# Patient Record
Sex: Male | Born: 1995 | Race: White | Hispanic: No | Marital: Single | Smoking: Never smoker
Health system: Southern US, Community
[De-identification: ages and names within clinical notes are randomized; demographics above are authoritative.]

## PROBLEM LIST (undated history)

## (undated) DIAGNOSIS — Z0282 Encounter for adoption services: Secondary | ICD-10-CM

## (undated) DIAGNOSIS — Z789 Other specified health status: Secondary | ICD-10-CM

## (undated) HISTORY — DX: Encounter for adoption services: Z02.82

## (undated) HISTORY — DX: Other specified health status: Z78.9

---
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
## 2019-04-15 ENCOUNTER — Other Ambulatory Visit: Payer: Self-pay

## 2019-04-15 ENCOUNTER — Ambulatory Visit: Payer: BC Managed Care – PPO | Admitting: Medical

## 2019-04-15 ENCOUNTER — Encounter: Payer: Self-pay | Admitting: Medical

## 2019-04-15 VITALS — BP 138/78 | HR 91 | Temp 97.7°F | Ht 72.0 in | Wt 232.8 lb

## 2019-04-15 DIAGNOSIS — M25561 Pain in right knee: Secondary | ICD-10-CM

## 2019-04-15 DIAGNOSIS — S8991XA Unspecified injury of right lower leg, initial encounter: Secondary | ICD-10-CM | POA: Insufficient documentation

## 2019-04-15 DIAGNOSIS — G8929 Other chronic pain: Secondary | ICD-10-CM | POA: Diagnosis not present
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
## 2019-04-15 NOTE — Patient Instructions (Signed)
Please go to [facility] for your right knee xray.   Their hours are 8am - 3:30 pm Monday - Friday.  Take your insurance card with you.  [facility] [phone]  [address], [address] [place], [place] [address]  [address] [place], [place] [address]

## 2019-04-22 ENCOUNTER — Ambulatory Visit
Admission: RE | Admit: 2019-04-22 | Discharge: 2019-04-22 | Disposition: A | Discharge status: Home / Self Care | Payer: BC Managed Care – PPO | Source: Ambulatory Visit | Attending: Medical | Admitting: Medical

## 2019-04-22 DIAGNOSIS — S8991XA Unspecified injury of right lower leg, initial encounter: Secondary | ICD-10-CM

## 2019-04-22 DIAGNOSIS — G8929 Other chronic pain: Secondary | ICD-10-CM

## 2019-04-22 DIAGNOSIS — M25561 Pain in right knee: Secondary | ICD-10-CM

## 2020-08-31 ENCOUNTER — Encounter: Payer: Self-pay | Admitting: Medical

## 2020-08-31 ENCOUNTER — Ambulatory Visit: Payer: BC Managed Care – PPO | Admitting: Medical

## 2020-08-31 ENCOUNTER — Other Ambulatory Visit: Payer: Self-pay

## 2020-08-31 VITALS — BP 160/82 | HR 88 | Ht 72.0 in | Wt 216.4 lb

## 2020-08-31 DIAGNOSIS — I10 Essential (primary) hypertension: Secondary | ICD-10-CM | POA: Diagnosis not present

## 2020-08-31 DIAGNOSIS — Z113 Encounter for screening for infections with a predominantly sexual mode of transmission: Secondary | ICD-10-CM

## 2020-08-31 DIAGNOSIS — Z136 Encounter for screening for cardiovascular disorders: Secondary | ICD-10-CM | POA: Insufficient documentation

## 2020-08-31 DIAGNOSIS — Z0000 Encounter for general adult medical examination without abnormal findings: Secondary | ICD-10-CM | POA: Diagnosis not present

## 2020-08-31 DIAGNOSIS — Z23 Encounter for immunization: Secondary | ICD-10-CM | POA: Diagnosis not present

## 2020-08-31 DIAGNOSIS — E739 Lactose intolerance, unspecified: Secondary | ICD-10-CM | POA: Insufficient documentation

## 2020-08-31 DIAGNOSIS — Z13220 Encounter for screening for lipoid disorders: Secondary | ICD-10-CM | POA: Diagnosis not present

## 2020-08-31 LAB — POCT URINALYSIS DIP (PROADVANTAGE DEVICE)
Bilirubin, UA: NEGATIVE
Blood, UA: NEGATIVE
Glucose, UA: NEGATIVE mg/dL
Ketones, POC UA: NEGATIVE mg/dL
Leukocytes, UA: NEGATIVE
Nitrite, UA: NEGATIVE
Protein Ur, POC: NEGATIVE mg/dL
Specific Gravity, Urine: 1.015
Urobilinogen, Ur: 0.2
pH, UA: 6 (ref 5.0–8.0)

## 2020-08-31 NOTE — Patient Instructions (Signed)
This visit was a preventative care visit, also known as wellness visit or routine physical.   Topics typically include healthy lifestyle, diet, exercise, preventative care, vaccinations, sick and well care, proper use of emergency dept and after hours care, as well as other concerns.     Recommendations: Continue to return yearly for your annual wellness and preventative care visits.  This gives [place] a chance to discuss healthy lifestyle, exercise, vaccinations, review your chart record, and perform screenings where appropriate.  I recommend you see your eye doctor yearly for routine vision care.  I recommend you see your dentist yearly for routine dental care including hygiene visits twice yearly.   Vaccination recommendations were reviewed Immunization History  Administered Date(s) Administered  . DTaP 09/23/1998, 11/05/1998, 03/09/1999, 09/03/1999, 09/10/2002  . HPV Quadrivalent 12/29/2008, 04/01/2009, 07/17/2009  . Hepatitis A 07/25/2005, 12/29/2005  . Hepatitis B 09/23/1998, 03/09/1999, 09/03/1999  . HiB (PRP-OMP) 09/23/1998, 11/05/1998, 09/03/1999  . IPV 09/23/1998, 11/05/1998, 09/03/1999, 09/10/2002  . Influenza,inj,Quad PF,6+ Mos 03/14/2016  . Influenza-Unspecified 03/11/2015  . MMR 12/01/1998, 09/10/2002  . Meningococcal B, OMV 07/10/2015, 07/10/2015, 08/18/2015  . Meningococcal Conjugate 09/10/2008  . Meningococcal Polysaccharide 03/27/2014  . Pneumococcal Conjugate-13 09/23/1998, 11/05/1998, 03/27/2014  . Td 09/10/2008  . Tdap 09/10/2008  . Varicella 02/19/2001, 12/29/2005    Counseled on the Td (tetanus, diptheria) vaccine.  Vaccine information sheet given. Td vaccine given after consent obtained.  Advised covid vaccine, yearly flu vaccine    Screening for cancer: Colon cancer screening: Age 25yo  Testicular cancer screening You should do a monthly self testicular exam if you are between 23-40 years old  We discussed PSA, prostate exam, and prostate cancer  screening risks/benefits.   Age 25  Skin cancer screening: Check your skin regularly for new changes, growing lesions, or other lesions of concern Come in for evaluation if you have skin lesions of concern.  Lung cancer screening: If you have a greater than 20 pack year history of tobacco use, then you may qualify for lung cancer screening with a chest CT scan.   Please call your insurance company to inquire about coverage for this test.  We currently don't have screenings for other cancers besides breast, cervical, colon, and lung cancers.  If you have a strong family history of cancer or have other cancer screening concerns, please let me know.    Bone health: Get at least 150 minutes of aerobic exercise weekly Get weight bearing exercise at least once weekly Bone density test:   A bone density test is an imaging test that uses a type of X-ray to measure the amount of calcium and other minerals in your bones.  The test may be used to diagnose or screen you for a condition that causes weak or thin bones (osteoporosis), predict your risk for a broken bone (fracture), or determine how well your osteoporosis treatment is working. The bone density test is recommended for females 23 and older, or females or males <09 if certain risk factors such as thyroid disease, long term use of steroids such as for asthma or rheumatological issues, vitamin D deficiency, estrogen deficiency, family history of osteoporosis, self or family history of fragility fracture in first degree relative.    Heart health: Get at least 150 minutes of aerobic exercise weekly Limit alcohol It is important to maintain a healthy blood pressure and healthy cholesterol numbers  Heart disease screening: Screening for heart disease includes screening for blood pressure, fasting lipids, glucose/diabetes screening, BMI height to weight  ratio, reviewed of smoking status, physical activity, and diet.    Goals include blood  pressure 120/80 or less, maintaining a healthy lipid/cholesterol profile, preventing diabetes or keeping diabetes numbers under good control, not smoking or using tobacco products, exercising most days per week or at least 150 minutes per week of exercise, and eating healthy variety of fruits and vegetables, healthy oils, and avoiding unhealthy food choices like fried food, fast food, high sugar and high cholesterol foods.    Other tests may possibly include EKG test, CT coronary calcium score, echocardiogram, exercise treadmill stress test.     Medical care options: I recommend you continue to seek care here first for routine care.  We try really hard to have available appointments Monday through Friday daytime hours for sick visits, acute visits, and physicals.  Urgent care should be used for after hours and weekends for significant issues that cannot wait till the next day.  The emergency department should be used for significant potentially life-threatening emergencies.  The emergency department is expensive, can often have long wait times for less significant concerns, so try to utilize primary care, urgent care, or telemedicine when possible to avoid unnecessary trips to the emergency department.  Virtual visits and telemedicine have been introduced since the pandemic started in 2020, and can be convenient ways to receive medical care.  We offer virtual appointments as well to assist you in a variety of options to seek medical care.    Separate significant issues discussed: High blood pressure  Check your blood pressure readings 3 to 4 days/week and write these down.  Goal is 120/70  High blood pressure is 140/90 or higher  Limit salt, cut back on alcohol, continue regular exercise  Pending labs we will likely begin some medication  You will probably need other evaluation including ultrasound the heart and possibly ultrasound of blood flow to kidneys/renal artery ultrasound  We can  consider sleep study to rule out sleep apnea  Lactose intolerance  Consider using Lactaid supplement over-the-counter when you eat dairy  If you want additional allergy testing this can be done through blood or through referral to allergist for more formal testing  You are not fasting today so some of the labs may not be completely 100% accurate such as blood sugar or cholesterol

## 2020-08-31 NOTE — Progress Notes (Signed)
Subjective:   HPI  Jesse Campbell is a 25 y.o. male who presents for Chief Complaint  Patient presents with  . Annual Exam    Physical with fasting labs     Patient Care Team: Altonio Schwertner, Camelia Eng, PA-C as PCP - General (Family Medicine) Sees dentist Sees eye doctor   Concerns: He thinks he may be lactose interact . In general with dairy gets bloating and gas x 2 years  He exercises regularly.  Typically 43m per day.    He does check some home BP readings due to prior elevated BPs.  Has been seeing 150-160 SBP.  No chest pain, no dyspnea, no swelling, no palpitations . No prior medication . He has had snoring when heavier.  During covid had gotten up to 260lb.    He is adopted so not sure about biological family history  Reviewed their medical, surgical, family, social, medication, and allergy history and updated chart as appropriate.  Past Medical History:  Diagnosis Date  . Adopted   . Medical history non-contributory     Past Surgical History:  Procedure Laterality Date  . WISDOM TOOTH EXTRACTION  2015    Family History  Adopted: Yes    No current outpatient medications on file.  No Known Allergies   Review of Systems Constitutional: -fever, -chills, -sweats, -unexpected weight change, -decreased appetite, -fatigue Allergy: -sneezing, -itching, -congestion Dermatology: -changing moles, --rash, -lumps ENT: -runny nose, -ear pain, -sore throat, -hoarseness, -sinus pain, -teeth pain, - ringing in ears, -hearing loss, -nosebleeds Cardiology: -chest pain, -palpitations, -swelling, -difficulty breathing when lying flat, -waking up short of breath Respiratory: -cough, -shortness of breath, -difficulty breathing with exercise or exertion, -wheezing, -coughing up blood Gastroenterology: -abdominal pain, -nausea, -vomiting, -diarrhea, -constipation, -blood in stool, -changes in bowel movement, -difficulty swallowing or eating Hematology: -bleeding, -bruising   Musculoskeletal: -joint aches, -muscle aches, -joint swelling, -back pain, -neck pain, -cramping, -changes in gait Ophthalmology: denies vision changes, eye redness, itching, discharge Urology: -burning with urination, -difficulty urinating, -blood in urine, -urinary frequency, -urgency, -incontinence Neurology: -headache, -weakness, -tingling, -numbness, -memory loss, -falls, -dizziness Psychology: -depressed mood, -agitation, -sleep problems Male GU: no testicular mass, pain, no lymph nodes swollen, no swelling, no rash.  [facility]  Decreased Interest 0  Down, Depressed, Hopeless 0  PHQ - 2 Score 0        Objective:  BP (!) 160/82   Pulse 88   Ht 6' (1.829 m)   Wt 216 lb 6.4 oz (98.2 kg)   BMI 29.35 kg/m   General appearance: alert, no distress, WD/WN, Caucasian male Skin: unremarkable HEENT: normocephalic, conjunctiva/corneas normal, sclerae anicteric, PERRLA, EOMi, nares patent, no discharge or erythema, pharynx normal Oral cavity: MMM, tongue normal, teeth normal Neck: supple, no lymphadenopathy, no thyromegaly, no masses, normal ROM, no bruits Chest: non tender, normal shape and expansion Heart: RRR, normal S1, S2, no murmurs Lungs: CTA bilaterally, no wheezes, rhonchi, or rales Abdomen: +bs, soft, non tender, non distended, no masses, no hepatomegaly, no splenomegaly, no bruits Back: non tender, normal ROM, no scoliosis Musculoskeletal: upper extremities non tender, no obvious deformity, normal ROM throughout, lower extremities non tender, no obvious deformity, normal ROM throughout Extremities: no edema, no cyanosis, no clubbing Pulses: 2+ symmetric, upper and lower extremities, normal cap refill Neurological: alert, oriented x 3, CN2-12 intact, strength normal upper extremities and lower extremities, sensation normal throughout, DTRs 2+ throughout, no cerebellar signs, gait normal Psychiatric: normal affect, behavior normal, pleasant  GU:  deferred Rectal: deferred  EKG Elevated blood pressure, rate 67 bpm, PR 140 ms, QRS 84 ms, QTC 401 ms, axis 81 degrees, normal sinus rhythm with sinus arrhythmia, early repolarization, no change from 2017 EKG    Assessment and Plan :   Encounter Diagnoses  Name Primary?  . Encounter for health maintenance examination in adult Yes  . Essential hypertension, benign   . Need for Td vaccine   . Screening for lipid disorders   . Screen for STD (sexually transmitted disease)   . Screening for heart disease   . Lactose intolerance     This visit was a preventative care visit, also known as wellness visit or routine physical.   Topics typically include healthy lifestyle, diet, exercise, preventative care, vaccinations, sick and well care, proper use of emergency dept and after hours care, as well as other concerns.     Recommendations: Continue to return yearly for your annual wellness and preventative care visits.  This gives [place] a chance to discuss healthy lifestyle, exercise, vaccinations, review your chart record, and perform screenings where appropriate.  I recommend you see your eye doctor yearly for routine vision care.  I recommend you see your dentist yearly for routine dental care including hygiene visits twice yearly.   Vaccination recommendations were reviewed Immunization History  Administered Date(s) Administered  . DTaP 09/23/1998, 11/05/1998, 03/09/1999, 09/03/1999, 09/10/2002  . HPV Quadrivalent 12/29/2008, 04/01/2009, 07/17/2009  . Hepatitis A 07/25/2005, 12/29/2005  . Hepatitis B 09/23/1998, 03/09/1999, 09/03/1999  . HiB (PRP-OMP) 09/23/1998, 11/05/1998, 09/03/1999  . IPV 09/23/1998, 11/05/1998, 09/03/1999, 09/10/2002  . Influenza,inj,Quad PF,6+ Mos 03/14/2016  . Influenza-Unspecified 03/11/2015  . MMR 12/01/1998, 09/10/2002  . Meningococcal B, OMV 07/10/2015, 07/10/2015, 08/18/2015  . Meningococcal Conjugate 09/10/2008  . Meningococcal Polysaccharide  03/27/2014  . Pneumococcal Conjugate-13 09/23/1998, 11/05/1998, 03/27/2014  . Td 09/10/2008  . Tdap 09/10/2008  . Varicella 02/19/2001, 12/29/2005    Counseled on the Td (tetanus, diptheria) vaccine.  Vaccine information sheet given. Td vaccine given after consent obtained.  Advised covid vaccine, yearly flu vaccine    Screening for cancer: Colon cancer screening: Age 33yo  Testicular cancer screening You should do a monthly self testicular exam if you are between 38-76 years old  We discussed PSA, prostate exam, and prostate cancer screening risks/benefits.   Age 89  Skin cancer screening: Check your skin regularly for new changes, growing lesions, or other lesions of concern Come in for evaluation if you have skin lesions of concern.  Lung cancer screening: If you have a greater than 20 pack year history of tobacco use, then you may qualify for lung cancer screening with a chest CT scan.   Please call your insurance company to inquire about coverage for this test.  We currently don't have screenings for other cancers besides breast, cervical, colon, and lung cancers.  If you have a strong family history of cancer or have other cancer screening concerns, please let me know.    Bone health: Get at least 150 minutes of aerobic exercise weekly Get weight bearing exercise at least once weekly Bone density test:   A bone density test is an imaging test that uses a type of X-ray to measure the amount of calcium and other minerals in your bones.  The test may be used to diagnose or screen you for a condition that causes weak or thin bones (osteoporosis), predict your risk for a broken bone (fracture), or determine how well your osteoporosis treatment is working. The bone  density test is recommended for females 49 and older, or females or males <07 if certain risk factors such as thyroid disease, long term use of steroids such as for asthma or rheumatological issues, vitamin D  deficiency, estrogen deficiency, family history of osteoporosis, self or family history of fragility fracture in first degree relative.    Heart health: Get at least 150 minutes of aerobic exercise weekly Limit alcohol It is important to maintain a healthy blood pressure and healthy cholesterol numbers  Heart disease screening: Screening for heart disease includes screening for blood pressure, fasting lipids, glucose/diabetes screening, BMI height to weight ratio, reviewed of smoking status, physical activity, and diet.    Goals include blood pressure 120/80 or less, maintaining a healthy lipid/cholesterol profile, preventing diabetes or keeping diabetes numbers under good control, not smoking or using tobacco products, exercising most days per week or at least 150 minutes per week of exercise, and eating healthy variety of fruits and vegetables, healthy oils, and avoiding unhealthy food choices like fried food, fast food, high sugar and high cholesterol foods.    Other tests may possibly include EKG test, CT coronary calcium score, echocardiogram, exercise treadmill stress test.     Medical care options: I recommend you continue to seek care here first for routine care.  We try really hard to have available appointments Monday through Friday daytime hours for sick visits, acute visits, and physicals.  Urgent care should be used for after hours and weekends for significant issues that cannot wait till the next day.  The emergency department should be used for significant potentially life-threatening emergencies.  The emergency department is expensive, can often have long wait times for less significant concerns, so try to utilize primary care, urgent care, or telemedicine when possible to avoid unnecessary trips to the emergency department.  Virtual visits and telemedicine have been introduced since the pandemic started in 2020, and can be convenient ways to receive medical care.  We offer virtual  appointments as well to assist you in a variety of options to seek medical care.    Separate significant issues discussed: High blood pressure  Check your blood pressure readings 3 to 4 days/week and write these down.  Goal is 120/70  High blood pressure is 140/90 or higher  Limit salt, cut back on alcohol, continue regular exercise  Pending labs we will likely begin some medication  You will probably need other evaluation including ultrasound the heart and possibly ultrasound of blood flow to kidneys/renal artery ultrasound  We can consider sleep study to rule out sleep apnea  Lactose intolerance  Consider using Lactaid supplement over-the-counter when you eat dairy  If you want additional allergy testing this can be done through blood or through referral to allergist for more formal testing  You are not fasting today so some of the labs may not be completely 100% accurate such as blood sugar or cholesterol    Caeson was seen today for annual exam.  Diagnoses and all orders for this visit:  Encounter for health maintenance examination in adult -     Comprehensive metabolic panel -     CBC -     LDL Cholesterol, Direct -     TSH -     EKG 12-Lead -     HIV Antibody (routine testing w rflx) -     RPR -     GC/Chlamydia Probe Amp -     Hepatitis C antibody -     Hepatitis  B surface antigen -     POCT Urinalysis DIP ([employer] Device)  Essential hypertension, benign  Need for Td vaccine  Screening for lipid disorders -     LDL Cholesterol, Direct  Screen for STD (sexually transmitted disease) -     HIV Antibody (routine testing w rflx) -     RPR -     GC/Chlamydia Probe Amp -     Hepatitis C antibody -     Hepatitis B surface antigen  Screening for heart disease -     EKG 12-Lead  Lactose intolerance  Other orders -     Td : Tetanus/diphtheria >7yo Preservative  free     Follow-up pending labs, yearly for physical

## 2020-09-01 ENCOUNTER — Encounter: Payer: Self-pay | Admitting: Medical

## 2020-09-01 LAB — COMPREHENSIVE METABOLIC PANEL
ALT: 27 IU/L (ref 0–44)
AST: 18 IU/L (ref 0–40)
Albumin/Globulin Ratio: 2 (ref 1.2–2.2)
Albumin: 4.9 g/dL (ref 4.1–5.2)
Alkaline Phosphatase: 98 IU/L (ref 44–121)
BUN/Creatinine Ratio: 16 (ref 9–20)
BUN: 13 mg/dL (ref 6–20)
Bilirubin Total: 0.4 mg/dL (ref 0.0–1.2)
CO2: 20 mmol/L (ref 20–29)
Calcium: 9.7 mg/dL (ref 8.7–10.2)
Chloride: 99 mmol/L (ref 96–106)
Creatinine, Ser: 0.81 mg/dL (ref 0.76–1.27)
Globulin, Total: 2.5 g/dL (ref 1.5–4.5)
Glucose: 109 mg/dL — ABNORMAL HIGH (ref 65–99)
Potassium: 4.6 mmol/L (ref 3.5–5.2)
Sodium: 137 mmol/L (ref 134–144)
Total Protein: 7.4 g/dL (ref 6.0–8.5)
eGFR: 127 mL/min/{1.73_m2} (ref >59)

## 2020-09-01 LAB — HEPATITIS C ANTIBODY: Hep C Virus Ab: <0.1 s/co ratio (ref 0.0–0.9)

## 2020-09-01 LAB — CBC
Hematocrit: 44.7 % (ref 37.5–51.0)
Hemoglobin: 15.7 g/dL (ref 13.0–17.7)
MCH: 30.4 pg (ref 26.6–33.0)
MCHC: 35.1 g/dL (ref 31.5–35.7)
MCV: 87 fL (ref 79–97)
Platelets: 250 10*3/uL (ref 150–450)
RBC: 5.16 x10E6/uL (ref 4.14–5.80)
RDW: 12.4 % (ref 11.6–15.4)
WBC: 6.9 10*3/uL (ref 3.4–10.8)

## 2020-09-01 LAB — HEPATITIS B SURFACE ANTIGEN: Hepatitis B Surface Ag: NEGATIVE

## 2020-09-01 LAB — RPR: RPR Ser Ql: NONREACTIVE

## 2020-09-01 LAB — HIV ANTIBODY (ROUTINE TESTING W REFLEX): HIV Screen 4th Generation wRfx: NONREACTIVE

## 2020-09-01 LAB — LDL CHOLESTEROL, DIRECT: LDL Direct: 145 mg/dL — ABNORMAL HIGH (ref 0–99)

## 2020-09-01 LAB — GC/CHLAMYDIA PROBE AMP
Chlamydia trachomatis, NAA: NEGATIVE
Neisseria Gonorrhoeae by PCR: NEGATIVE

## 2020-09-01 LAB — TSH: TSH: 2.89 u[IU]/mL (ref 0.450–4.500)
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
## 2021-12-15 ENCOUNTER — Encounter: Payer: Self-pay | Admitting: Internal Medicine
